# Patient Record
Sex: Female | Born: 1970 | Race: White | Hispanic: No | Marital: Married | Smoking: Former smoker
Health system: Southern US, Community
[De-identification: ages and names within clinical notes are randomized; demographics above are authoritative.]

## PROBLEM LIST (undated history)

## (undated) DIAGNOSIS — Z79899 Other long term (current) drug therapy: Secondary | ICD-10-CM

## (undated) DIAGNOSIS — E669 Obesity, unspecified: Secondary | ICD-10-CM

## (undated) DIAGNOSIS — A6009 Herpesviral infection of other urogenital tract: Secondary | ICD-10-CM

## (undated) DIAGNOSIS — E559 Vitamin D deficiency, unspecified: Secondary | ICD-10-CM

## (undated) DIAGNOSIS — K449 Diaphragmatic hernia without obstruction or gangrene: Secondary | ICD-10-CM

## (undated) DIAGNOSIS — F419 Anxiety disorder, unspecified: Secondary | ICD-10-CM

## (undated) DIAGNOSIS — E119 Type 2 diabetes mellitus without complications: Secondary | ICD-10-CM

## (undated) DIAGNOSIS — R1310 Dysphagia, unspecified: Secondary | ICD-10-CM

## (undated) DIAGNOSIS — K635 Polyp of colon: Secondary | ICD-10-CM

## (undated) DIAGNOSIS — E538 Deficiency of other specified B group vitamins: Secondary | ICD-10-CM

## (undated) DIAGNOSIS — F4310 Post-traumatic stress disorder, unspecified: Secondary | ICD-10-CM

## (undated) DIAGNOSIS — K625 Hemorrhage of anus and rectum: Secondary | ICD-10-CM

## (undated) DIAGNOSIS — K219 Gastro-esophageal reflux disease without esophagitis: Secondary | ICD-10-CM

## (undated) DIAGNOSIS — E785 Hyperlipidemia, unspecified: Secondary | ICD-10-CM

## (undated) DIAGNOSIS — Z8719 Personal history of other diseases of the digestive system: Secondary | ICD-10-CM

## (undated) DIAGNOSIS — F319 Bipolar disorder, unspecified: Secondary | ICD-10-CM

## (undated) DIAGNOSIS — K760 Fatty (change of) liver, not elsewhere classified: Secondary | ICD-10-CM

## (undated) HISTORY — PX: ABDOMINAL HYSTERECTOMY: SHX81

## (undated) HISTORY — PX: LIVER BIOPSY: SHX301

## (undated) HISTORY — PX: CHOLECYSTECTOMY: SHX55

## (undated) HISTORY — PX: COLONOSCOPY: SHX174

## (undated) HISTORY — PX: FLEXIBLE SIGMOIDOSCOPY: SHX1649

## (undated) HISTORY — PX: TONSILLECTOMY: SUR1361

---

## 1998-04-28 ENCOUNTER — Emergency Department (HOSPITAL_COMMUNITY): Admission: EM | Admit: 1998-04-28 | Discharge: 1998-04-28 | Payer: Self-pay | Admitting: Emergency Medicine

## 1998-04-29 ENCOUNTER — Ambulatory Visit (HOSPITAL_COMMUNITY): Admission: AD | Admit: 1998-04-29 | Discharge: 1998-04-29 | Payer: Self-pay | Admitting: Obstetrics and Gynecology

## 1998-04-29 ENCOUNTER — Other Ambulatory Visit: Admission: RE | Admit: 1998-04-29 | Discharge: 1998-04-29 | Payer: Self-pay | Admitting: Obstetrics and Gynecology

## 2005-04-28 ENCOUNTER — Emergency Department: Payer: Self-pay | Admitting: Unknown Physician Specialty

## 2005-04-28 ENCOUNTER — Other Ambulatory Visit: Payer: Self-pay

## 2005-11-22 ENCOUNTER — Encounter: Payer: Self-pay | Admitting: Emergency Medicine

## 2006-11-29 ENCOUNTER — Ambulatory Visit: Payer: Self-pay

## 2007-07-29 ENCOUNTER — Ambulatory Visit: Payer: Self-pay | Admitting: Unknown Physician Specialty

## 2007-08-31 ENCOUNTER — Emergency Department: Payer: Self-pay | Admitting: Emergency Medicine

## 2007-09-18 ENCOUNTER — Ambulatory Visit: Payer: Self-pay | Admitting: Emergency Medicine

## 2008-11-21 ENCOUNTER — Emergency Department: Payer: Self-pay | Admitting: Emergency Medicine

## 2008-11-27 ENCOUNTER — Ambulatory Visit: Payer: Self-pay | Admitting: Family Medicine

## 2011-01-12 ENCOUNTER — Ambulatory Visit: Payer: Self-pay

## 2011-01-19 ENCOUNTER — Ambulatory Visit: Payer: Self-pay

## 2011-05-17 ENCOUNTER — Ambulatory Visit: Payer: Self-pay | Admitting: Family Medicine

## 2011-05-24 ENCOUNTER — Ambulatory Visit: Payer: Self-pay | Admitting: Family Medicine

## 2012-08-02 ENCOUNTER — Ambulatory Visit: Payer: Self-pay | Admitting: Unknown Physician Specialty

## 2012-08-19 ENCOUNTER — Ambulatory Visit: Payer: Self-pay | Admitting: Unknown Physician Specialty

## 2012-08-20 LAB — PATHOLOGY REPORT

## 2014-08-25 ENCOUNTER — Ambulatory Visit: Payer: BLUE CROSS/BLUE SHIELD | Admitting: Podiatry

## 2015-05-26 ENCOUNTER — Other Ambulatory Visit: Payer: Self-pay | Admitting: Family Medicine

## 2015-05-26 DIAGNOSIS — Z1231 Encounter for screening mammogram for malignant neoplasm of breast: Secondary | ICD-10-CM

## 2015-06-08 ENCOUNTER — Ambulatory Visit
Admission: RE | Admit: 2015-06-08 | Discharge: 2015-06-08 | Disposition: A | Discharge status: Home / Self Care | Payer: BLUE CROSS/BLUE SHIELD | Source: Ambulatory Visit | Attending: Family Medicine | Admitting: Family Medicine

## 2015-06-08 DIAGNOSIS — Z1231 Encounter for screening mammogram for malignant neoplasm of breast: Secondary | ICD-10-CM | POA: Diagnosis not present

## 2016-06-09 ENCOUNTER — Ambulatory Visit
Admission: RE | Admit: 2016-06-09 | Discharge: 2016-06-09 | Disposition: A | Discharge status: Home / Self Care | Payer: BLUE CROSS/BLUE SHIELD | Source: Ambulatory Visit | Attending: Family Medicine | Admitting: Family Medicine

## 2016-06-09 ENCOUNTER — Other Ambulatory Visit: Payer: Self-pay | Admitting: Family Medicine

## 2016-06-09 DIAGNOSIS — Z1231 Encounter for screening mammogram for malignant neoplasm of breast: Secondary | ICD-10-CM | POA: Diagnosis not present

## 2016-06-09 DIAGNOSIS — R928 Other abnormal and inconclusive findings on diagnostic imaging of breast: Secondary | ICD-10-CM | POA: Diagnosis not present

## 2016-06-14 ENCOUNTER — Other Ambulatory Visit: Payer: Self-pay | Admitting: Family Medicine

## 2016-06-14 DIAGNOSIS — N6310 Unspecified lump in the right breast, unspecified quadrant: Secondary | ICD-10-CM

## 2016-06-29 ENCOUNTER — Ambulatory Visit: Payer: BLUE CROSS/BLUE SHIELD

## 2016-08-03 ENCOUNTER — Encounter: Payer: Self-pay | Admitting: *Deleted

## 2016-08-04 ENCOUNTER — Ambulatory Visit
Admission: RE | Admit: 2016-08-04 | Discharge: 2016-08-04 | Disposition: A | Discharge status: Home / Self Care | Payer: BLUE CROSS/BLUE SHIELD | Source: Ambulatory Visit | Attending: Unknown Physician Specialty | Admitting: Unknown Physician Specialty

## 2016-08-04 ENCOUNTER — Encounter
Admission: RE | Disposition: A | Discharge status: Home / Self Care | Payer: Self-pay | Source: Ambulatory Visit | Attending: Unknown Physician Specialty

## 2016-08-04 ENCOUNTER — Ambulatory Visit: Payer: BLUE CROSS/BLUE SHIELD | Admitting: Anesthesiology

## 2016-08-04 DIAGNOSIS — Z7984 Long term (current) use of oral hypoglycemic drugs: Secondary | ICD-10-CM | POA: Diagnosis not present

## 2016-08-04 DIAGNOSIS — A6000 Herpesviral infection of urogenital system, unspecified: Secondary | ICD-10-CM | POA: Diagnosis not present

## 2016-08-04 DIAGNOSIS — D123 Benign neoplasm of transverse colon: Secondary | ICD-10-CM | POA: Insufficient documentation

## 2016-08-04 DIAGNOSIS — F419 Anxiety disorder, unspecified: Secondary | ICD-10-CM | POA: Diagnosis not present

## 2016-08-04 DIAGNOSIS — Z87891 Personal history of nicotine dependence: Secondary | ICD-10-CM | POA: Diagnosis not present

## 2016-08-04 DIAGNOSIS — Z888 Allergy status to other drugs, medicaments and biological substances status: Secondary | ICD-10-CM | POA: Diagnosis not present

## 2016-08-04 DIAGNOSIS — E669 Obesity, unspecified: Secondary | ICD-10-CM | POA: Diagnosis not present

## 2016-08-04 DIAGNOSIS — E785 Hyperlipidemia, unspecified: Secondary | ICD-10-CM | POA: Insufficient documentation

## 2016-08-04 DIAGNOSIS — Z86010 Personal history of colonic polyps: Secondary | ICD-10-CM | POA: Diagnosis not present

## 2016-08-04 DIAGNOSIS — K648 Other hemorrhoids: Secondary | ICD-10-CM | POA: Insufficient documentation

## 2016-08-04 DIAGNOSIS — E119 Type 2 diabetes mellitus without complications: Secondary | ICD-10-CM | POA: Insufficient documentation

## 2016-08-04 DIAGNOSIS — K219 Gastro-esophageal reflux disease without esophagitis: Secondary | ICD-10-CM | POA: Diagnosis not present

## 2016-08-04 DIAGNOSIS — K449 Diaphragmatic hernia without obstruction or gangrene: Secondary | ICD-10-CM | POA: Diagnosis not present

## 2016-08-04 DIAGNOSIS — D122 Benign neoplasm of ascending colon: Secondary | ICD-10-CM | POA: Insufficient documentation

## 2016-08-04 DIAGNOSIS — F319 Bipolar disorder, unspecified: Secondary | ICD-10-CM | POA: Diagnosis not present

## 2016-08-04 DIAGNOSIS — Z90710 Acquired absence of both cervix and uterus: Secondary | ICD-10-CM | POA: Insufficient documentation

## 2016-08-04 DIAGNOSIS — Z880 Allergy status to penicillin: Secondary | ICD-10-CM | POA: Diagnosis not present

## 2016-08-04 DIAGNOSIS — K222 Esophageal obstruction: Secondary | ICD-10-CM | POA: Insufficient documentation

## 2016-08-04 DIAGNOSIS — Z6831 Body mass index (BMI) 31.0-31.9, adult: Secondary | ICD-10-CM | POA: Insufficient documentation

## 2016-08-04 DIAGNOSIS — K760 Fatty (change of) liver, not elsewhere classified: Secondary | ICD-10-CM | POA: Insufficient documentation

## 2016-08-04 DIAGNOSIS — Z1211 Encounter for screening for malignant neoplasm of colon: Secondary | ICD-10-CM | POA: Insufficient documentation

## 2016-08-04 HISTORY — DX: Fatty (change of) liver, not elsewhere classified: K76.0

## 2016-08-04 HISTORY — DX: Personal history of other diseases of the digestive system: Z87.19

## 2016-08-04 HISTORY — DX: Hyperlipidemia, unspecified: E78.5

## 2016-08-04 HISTORY — DX: Diaphragmatic hernia without obstruction or gangrene: K44.9

## 2016-08-04 HISTORY — DX: Herpesviral infection of other urogenital tract: A60.09

## 2016-08-04 HISTORY — DX: Dysphagia, unspecified: R13.10

## 2016-08-04 HISTORY — PX: COLONOSCOPY WITH PROPOFOL: SHX5780

## 2016-08-04 HISTORY — DX: Hemorrhage of anus and rectum: K62.5

## 2016-08-04 HISTORY — DX: Polyp of colon: K63.5

## 2016-08-04 HISTORY — DX: Bipolar disorder, unspecified: F31.9

## 2016-08-04 HISTORY — DX: Gastro-esophageal reflux disease without esophagitis: K21.9

## 2016-08-04 HISTORY — DX: Anxiety disorder, unspecified: F41.9

## 2016-08-04 HISTORY — DX: Type 2 diabetes mellitus without complications: E11.9

## 2016-08-04 HISTORY — PX: ESOPHAGOGASTRODUODENOSCOPY (EGD) WITH PROPOFOL: SHX5813

## 2016-08-04 HISTORY — DX: Obesity, unspecified: E66.9

## 2016-08-04 LAB — GLUCOSE, CAPILLARY: Glucose-Capillary: 96 mg/dL (ref 65–99)

## 2016-08-04 SURGERY — COLONOSCOPY WITH PROPOFOL
Anesthesia: General

## 2016-08-04 MED ORDER — PROPOFOL 500 MG/50ML IV EMUL
INTRAVENOUS | Status: AC
  Filled 2016-08-04: qty 50

## 2016-08-04 MED ORDER — FENTANYL CITRATE (PF) 100 MCG/2ML IJ SOLN
INTRAMUSCULAR | Status: AC
  Filled 2016-08-04: qty 2

## 2016-08-04 MED ORDER — LIDOCAINE HCL (PF) 2 % IJ SOLN
INTRAMUSCULAR | Status: DC | PRN
Start: 2016-08-04 — End: 2016-08-04
  Administered 2016-08-04: 50 mg

## 2016-08-04 MED ORDER — FENTANYL CITRATE (PF) 100 MCG/2ML IJ SOLN
INTRAMUSCULAR | Status: DC | PRN
  Administered 2016-08-04 (×2): 50 ug via INTRAVENOUS

## 2016-08-04 MED ORDER — SODIUM CHLORIDE 0.9 % IV SOLN
INTRAVENOUS | Status: DC
  Administered 2016-08-04: 09:00:00 via INTRAVENOUS

## 2016-08-04 MED ORDER — PHENYLEPHRINE HCL 10 MG/ML IJ SOLN
INTRAMUSCULAR | Status: DC | PRN
  Administered 2016-08-04 (×3): 80 ug via INTRAVENOUS

## 2016-08-04 MED ORDER — PROPOFOL 10 MG/ML IV BOLUS
INTRAVENOUS | Status: DC | PRN
  Administered 2016-08-04: 50 mg via INTRAVENOUS

## 2016-08-04 MED ORDER — MIDAZOLAM HCL 5 MG/5ML IJ SOLN
INTRAMUSCULAR | Status: DC | PRN
  Administered 2016-08-04: 2 mg via INTRAVENOUS
  Administered 2016-08-04 (×2): 1 mg via INTRAVENOUS

## 2016-08-04 MED ORDER — MIDAZOLAM HCL 2 MG/2ML IJ SOLN
INTRAMUSCULAR | Status: AC
Start: 2016-08-04 — End: 2016-08-04
  Filled 2016-08-04: qty 2

## 2016-08-04 MED ORDER — MIDAZOLAM HCL 2 MG/2ML IJ SOLN
INTRAMUSCULAR | Status: AC
  Filled 2016-08-04: qty 2

## 2016-08-04 MED ORDER — SODIUM CHLORIDE 0.9 % IV SOLN: INTRAVENOUS | Status: DC

## 2016-08-04 MED ORDER — PROPOFOL 10 MG/ML IV BOLUS
INTRAVENOUS | Status: AC
  Filled 2016-08-04: qty 20

## 2016-08-04 MED ORDER — PROPOFOL 500 MG/50ML IV EMUL
INTRAVENOUS | Status: DC | PRN
  Administered 2016-08-04: 100 ug/kg/min via INTRAVENOUS

## 2016-08-04 NOTE — Transfer of Care (Signed)
Immediate Anesthesia Transfer of Care Note  Patient: Alison Mcclain  Procedure(s) Performed: Procedure(s): COLONOSCOPY WITH PROPOFOL (N/A) ESOPHAGOGASTRODUODENOSCOPY (EGD) WITH PROPOFOL (N/A)  Patient Location: PACU  Anesthesia Type:General  Level of Consciousness: sedated  Airway & Oxygen Therapy: Patient Spontanous Breathing and Patient connected to nasal cannula oxygen  Post-op Assessment: Report given to RN and Post -op Vital signs reviewed and stable  Post vital signs: Reviewed and stable  Last Vitals:  Vitals:   08/04/16 0830  BP: 100/74  Pulse: 77  Resp: 18  Temp: 36.5 C    Last Pain:  Vitals:   08/04/16 0830  TempSrc: Tympanic         Complications: No apparent anesthesia complications

## 2016-08-04 NOTE — Op Note (Signed)
[facility] Patient Name: Alison Mcclain Procedure Date: 08/04/2016 9:43 AM MRN: [id number] Account #: [id number] Date of Birth: 02-Jan-1971 Admit Type: Outpatient Age: 46 Room: [facility] ENDO ROOM 3 Gender: Female Note Status: Finalized Procedure:            Colonoscopy Indications:          High risk colon cancer surveillance: Personal history                        of colonic polyps Providers:            Manya Silvas, MD Referring MD:         Rubbie Battiest. Iona Beard MD, MD (Referring MD) Medicines:            Propofol per Anesthesia Complications:        No immediate complications. Procedure:            Pre-Anesthesia Assessment:                       - After reviewing the risks and benefits, the patient                        was deemed in satisfactory condition to undergo the                        procedure.                       After obtaining informed consent, the colonoscope was                        passed under direct vision. Throughout the procedure,                        the patient's blood pressure, pulse, and oxygen                        saturations were monitored continuously. The                        Colonoscope was introduced through the anus and                        advanced to the the cecum, identified by appendiceal                        orifice and ileocecal valve. The colonoscopy was                        somewhat difficult due to restricted mobility of the                        colon and a tortuous colon. Successful completion of                        the procedure was aided by applying abdominal pressure.                        The patient tolerated the procedure well. The quality  of the bowel preparation was adequate to identify                        polyps 6 mm and larger in size. Findings:      A small polyp was found in the proximal ascending colon. The polyp was       sessile. The polyp was  removed with a cold snare. Resection and       retrieval were complete.      A small polyp was found in the hepatic flexure. The polyp was sessile.       The polyp was removed with a hot snare. Resection and retrieval were       complete.      Internal hemorrhoids were found during endoscopy. The hemorrhoids were       small.      The exam was otherwise without abnormality. Impression:           - One small polyp in the proximal ascending colon,                        removed with a cold snare. Resected and retrieved.                       - One small polyp at the hepatic flexure, removed with                        a hot snare. Resected and retrieved.                       - Internal hemorrhoids.                       - The examination was otherwise normal. Recommendation:       - Await pathology results. Manya Silvas, MD 08/04/2016 10:41:20 AM This report has been signed electronically. Number of Addenda: 0 Note Initiated On: 08/04/2016 9:43 AM Scope Withdrawal Time: 0 hours 16 minutes 10 seconds  Total Procedure Duration: 0 hours 32 minutes 30 seconds       [facility]

## 2016-08-04 NOTE — Anesthesia Postprocedure Evaluation (Signed)
Anesthesia Post Note  Patient: Alison Mcclain  Procedure(s) Performed: Procedure(s) (LRB): COLONOSCOPY WITH PROPOFOL (N/A) ESOPHAGOGASTRODUODENOSCOPY (EGD) WITH PROPOFOL (N/A)  Patient location during evaluation: Endoscopy Anesthesia Type: General Level of consciousness: awake and alert and oriented Pain management: pain level controlled Vital Signs Assessment: post-procedure vital signs reviewed and stable Respiratory status: spontaneous breathing, nonlabored ventilation and respiratory function stable Cardiovascular status: blood pressure returned to baseline and stable Postop Assessment: no signs of nausea or vomiting Anesthetic complications: no     Last Vitals:  Vitals:   08/04/16 1042 08/04/16 1052  BP: (!) 85/59 90/61  Pulse: 74 71  Resp: (!) 22 12  Temp: 36.7 C     Last Pain:  Vitals:   08/04/16 1042  TempSrc: Tympanic                 Karli Wickizer

## 2016-08-04 NOTE — Op Note (Signed)
[facility]enter[facility] Patient Name: Alison Mcclain Procedure Date: 08/04/2016 9:44 AM MRN: [id number] Account #: [id number] Date of Birth: Apr 02, 1971 Admit Type: Outpatient Age: 46 Room: [facility] ENDO ROOM 3 Gender: Female Note Status: Finalized Procedure:            Upper GI endoscopy Indications:          Dysphagia Providers:            Manya Silvas, MD Referring MD:         Rubbie Battiest. Iona Beard MD, MD (Referring MD) Medicines:            Propofol per Anesthesia Complications:        No immediate complications. Procedure:            Pre-Anesthesia Assessment:                       - After reviewing the risks and benefits, the patient                        was deemed in satisfactory condition to undergo the                        procedure.                       - After reviewing the risks and benefits, the patient                        was deemed in satisfactory condition to undergo the                        procedure.                       After obtaining informed consent, the endoscope was                        passed under direct vision. Throughout the procedure,                        the patient's blood pressure, pulse, and oxygen                        saturations were monitored continuously. The Endoscope                        was introduced through the mouth, and advanced to the                        second part of duodenum. The upper GI endoscopy was                        accomplished without difficulty. The patient tolerated                        the procedure well. Findings:      A mild Schatzki ring (acquired) was found at the gastroesophageal       junction. A guidewire was placed and the scope was withdrawn. Dilation       was performed with a Savary dilator with mild resistance at 16 mm and 17  mm.      A small hiatal hernia was present.      The examined duodenum was normal. Impression:           - Mild Schatzki ring. Dilated.                      - Small hiatal hernia.                       - Normal examined duodenum.                       - No specimens collected. Recommendation:       - soft food for 3 days, eat slowly, chew well, take                        small bites Procedure Code(s):    --- Professional ---                       [phone], Esophagogastroduodenoscopy, flexible, transoral;                        with insertion of guide wire followed by passage of                        dilator(s) through esophagus over guide wire Diagnosis Code(s):    --- Professional ---                       K22.2, Esophageal obstruction                       K44.9, Diaphragmatic hernia without obstruction or                        gangrene                       R13.10, Dysphagia, unspecified CPT copyright 2016 American Medical Association. All rights reserved. The codes documented in this report are preliminary and upon coder review may  be revised to meet current compliance requirements. Manya Silvas, MD 08/04/2016 10:01:27 AM This report has been signed electronically. Number of Addenda: 0 Note Initiated On: 08/04/2016 9:44 AM      [facility]

## 2016-08-04 NOTE — Anesthesia Preprocedure Evaluation (Signed)
Anesthesia Evaluation  Patient identified by MRN, date of birth, ID band Patient awake    Reviewed: Allergy & Precautions, NPO status , Patient's Chart, lab work & pertinent test results  History of Anesthesia Complications Negative for: history of anesthetic complications  Airway Mallampati: II  TM Distance: >3 FB Neck ROM: Full    Dental no notable dental hx.    Pulmonary neg sleep apnea, neg COPD, former smoker,    breath sounds clear to auscultation- rhonchi (-) wheezing      Cardiovascular Exercise Tolerance: Good (-) hypertension(-) CAD and (-) Past MI  Rhythm:Regular Rate:Normal - Systolic murmurs and - Diastolic murmurs    Neuro/Psych Anxiety Bipolar Disorder negative neurological ROS     GI/Hepatic Neg liver ROS, hiatal hernia, GERD  ,  Endo/Other  diabetes, Type 2, Oral Hypoglycemic Agents  Renal/GU negative Renal ROS     Musculoskeletal negative musculoskeletal ROS (+)   Abdominal (+) + obese,   Peds  Hematology negative hematology ROS (+)   Anesthesia Other Findings Past Medical History: No date: Anxiety No date: Bipolar 1 disorder (HCC) No date: Colon polyp No date: Diabetes mellitus without complication (HCC) No date: Dysphagia No date: Fatty liver No date: GERD (gastroesophageal reflux disease) No date: Herpes genitalis in women No date: Hiatal hernia No date: History of hiatal hernia No date: Hyperlipidemia No date: Obesity No date: Rectal bleeding   Reproductive/Obstetrics                             Anesthesia Physical Anesthesia Plan  ASA: III  Anesthesia Plan: General   Post-op Pain Management:    Induction: Intravenous  Airway Management Planned: Natural Airway  Additional Equipment:   Intra-op Plan:   Post-operative Plan:   Informed Consent: I have reviewed the patients History and Physical, chart, labs and discussed the procedure including  the risks, benefits and alternatives for the proposed anesthesia with the patient or authorized representative who has indicated his/her understanding and acceptance.   Dental advisory given  Plan Discussed with: CRNA and Anesthesiologist  Anesthesia Plan Comments:         Anesthesia Quick Evaluation

## 2016-08-04 NOTE — H&P (Signed)
Primary Care Physician:  Sharyne Peach, MD Primary Gastroenterologist:  Dr. Vira Agar  Pre-Procedure History & Physical: HPI:  Alison Mcclain is a 46 y.o. female is here for an endoscopy and colonoscopy.  She has dysphagia, GERD, rectal bleeding, hx of colon polyps.   Past Medical History:  Diagnosis Date  . Anxiety   . Bipolar 1 disorder ([place])   . Colon polyp   . Diabetes mellitus without complication ([place])   . Dysphagia   . Fatty liver   . GERD (gastroesophageal reflux disease)   . Herpes genitalis in women   . Hiatal hernia   . History of hiatal hernia   . Hyperlipidemia   . Obesity   . Rectal bleeding     Past Surgical History:  Procedure Laterality Date  . ABDOMINAL HYSTERECTOMY    . CHOLECYSTECTOMY    . COLONOSCOPY    . LIVER BIOPSY    . TONSILLECTOMY      Prior to Admission medications   Medication Sig Start Date End Date Taking? Authorizing Provider  buPROPion (WELLBUTRIN XL) 150 MG 24 hr tablet Take 150 mg by mouth daily.   Yes Historical Provider, MD  clonazePAM (KLONOPIN) 0.5 MG tablet Take 0.5 mg by mouth at bedtime. May take 1 tab daily prn   Yes Historical Provider, MD  glipiZIDE (GLUCOTROL XL) 10 MG 24 hr tablet Take 10 mg by mouth daily. Take 1 tablet by mouth once a day   Yes Historical Provider, MD  lithium 300 MG tablet Take 900 mg by mouth at bedtime.   Yes Historical Provider, MD  simvastatin (ZOCOR) 10 MG tablet Take 10 mg by mouth daily.   Yes Historical Provider, MD  sitaGLIPtin (JANUVIA) 100 MG tablet Take 100 mg by mouth daily.   Yes Historical Provider, MD  SUMAtriptan (IMITREX) 25 MG tablet Take by mouth.   Yes Historical Provider, MD  traZODone (DESYREL) 100 MG tablet Take 200 mg by mouth at bedtime.   Yes Historical Provider, MD  hydrocortisone (ANUSOL-HC) 25 MG suppository Place 25 mg rectally 2 (two) times daily.    Historical Provider, MD    Allergies as of 08/01/2016  . (Not on File)    Family History  Problem Relation Age of  Onset  . Breast cancer Mother 49    Social History   Social History  . Marital status: Married    Spouse name: N/A  . Number of children: N/A  . Years of education: N/A   Occupational History  . Not on file.   Social History Main Topics  . Smoking status: Former Smoker    Quit date: 07/24/2010  . Smokeless tobacco: Never Used  . Alcohol use Yes     Comment: 1.0-2 ounce a week last dose 1 beer last w/e  . Drug use: No  . Sexual activity: Not on file   Other Topics Concern  . Not on file   Social History Narrative  . No narrative on file    Review of Systems: See HPI, otherwise negative ROS  Physical Exam: BP 100/74   Pulse 77   Temp 97.7 F (36.5 C) (Tympanic)   Resp 18   Ht 5\' 6"  (1.676 m)   Wt 88.5 kg (195 lb)   SpO2 97%   BMI 31.47 kg/m  General:   Alert,  pleasant and cooperative in NAD Head:  Normocephalic and atraumatic. Neck:  Supple; no masses or thyromegaly. Lungs:  Clear throughout to auscultation.  Heart:  Regular rate and rhythm. Abdomen:  Soft, nontender and nondistended. Normal bowel sounds, without guarding, and without rebound.   Neurologic:  Alert and  oriented x4;  grossly normal neurologically.  Impression/Plan: Alison Mcclain is here for an endoscopy and colonoscopy to be performed for rectal bleeding, PH colon polyps, abdominal pain. Dysphagia.  Risks, benefits, limitations, and alternatives regarding  endoscopy and colonoscopy have been reviewed with the patient.  Questions have been answered.  All parties agreeable.   Gaylyn Cheers, MD  08/04/2016, 9:42 AM

## 2016-08-06 ENCOUNTER — Encounter: Payer: Self-pay | Admitting: Unknown Physician Specialty

## 2016-08-07 LAB — SURGICAL PATHOLOGY

## 2019-11-05 ENCOUNTER — Other Ambulatory Visit: Payer: Self-pay | Admitting: Otolaryngology

## 2019-11-05 DIAGNOSIS — R1310 Dysphagia, unspecified: Secondary | ICD-10-CM

## 2019-11-10 ENCOUNTER — Ambulatory Visit: Payer: Self-pay | Attending: Otolaryngology

## 2019-11-28 ENCOUNTER — Other Ambulatory Visit: Payer: Self-pay | Admitting: Otolaryngology

## 2019-11-28 ENCOUNTER — Ambulatory Visit
Admission: RE | Admit: 2019-11-28 | Discharge: 2019-11-28 | Disposition: A | Discharge status: Home / Self Care | Payer: BC Managed Care – PPO | Source: Ambulatory Visit | Attending: Otolaryngology | Admitting: Otolaryngology

## 2019-11-28 ENCOUNTER — Other Ambulatory Visit: Payer: Self-pay

## 2019-11-28 DIAGNOSIS — R1310 Dysphagia, unspecified: Secondary | ICD-10-CM | POA: Diagnosis not present

## 2019-12-01 ENCOUNTER — Ambulatory Visit: Payer: Self-pay

## 2022-01-15 IMAGING — RF DG ESOPHAGUS
7 series · 14 of 24 positions shown · non-contrast
Comparison: None.

CLINICAL DATA: Dysphagia

EXAM:
ESOPHOGRAM / BARIUM SWALLOW / BARIUM TABLET STUDY
TECHNIQUE: Combined double contrast and single contrast examination performed
using effervescent crystals, thick barium liquid, and thin barium
liquid. The patient was observed with fluoroscopy swallowing a 13 mm
barium sulphate tablet.
FLUOROSCOPY TIME:  Fluoroscopy Time:  1 minutes and 30 seconds.
Radiation Exposure Index (if provided by the fluoroscopic device):
73 mGy
Number of Acquired Spot Images:

[Series 1: fluoro_barium bariatric 2fps_bw · 0.17mm/px · 2 of 14 frames shown (1 of 5)]
[frame 3/14]
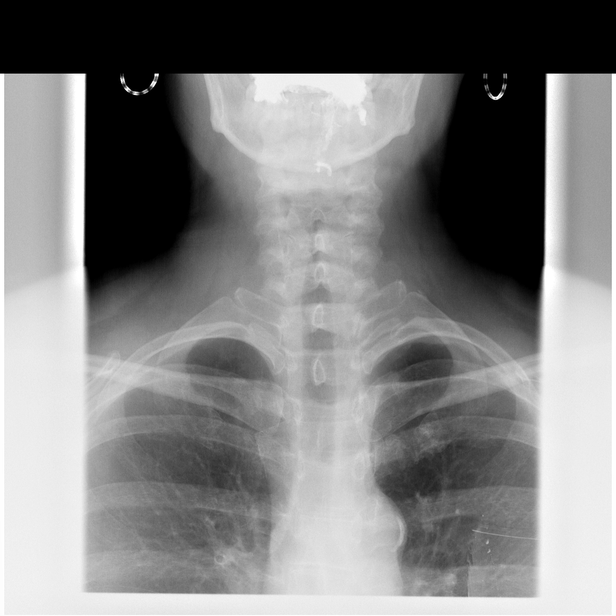
[frame 12/14]
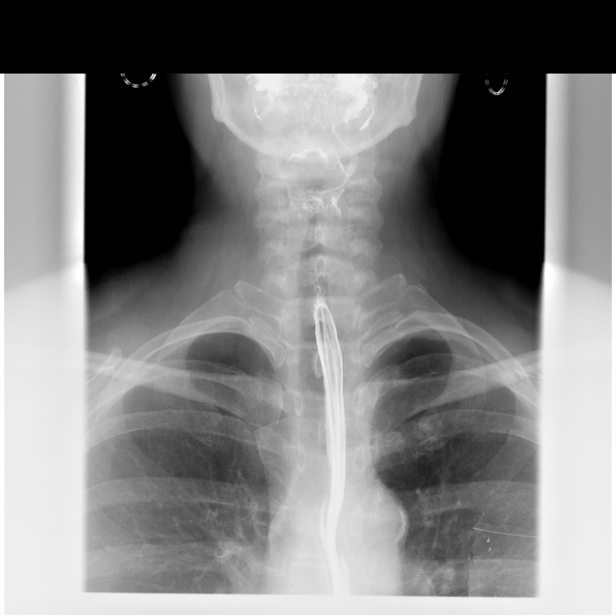

[Series 2: fluoro_barium bariatric 2fps_bw · 0.17mm/px · 1 of 18 frames shown (2 of 5)]
[frame 14/18]
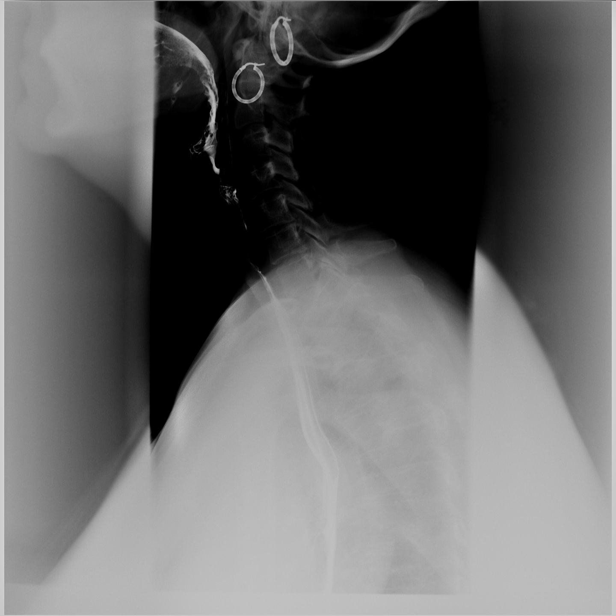

[Series 3: fluoro_barium bariatric 2fps_bw · 0.18mm/px · 3 of 40 frames shown (3 of 5)]
[frame 7/40]
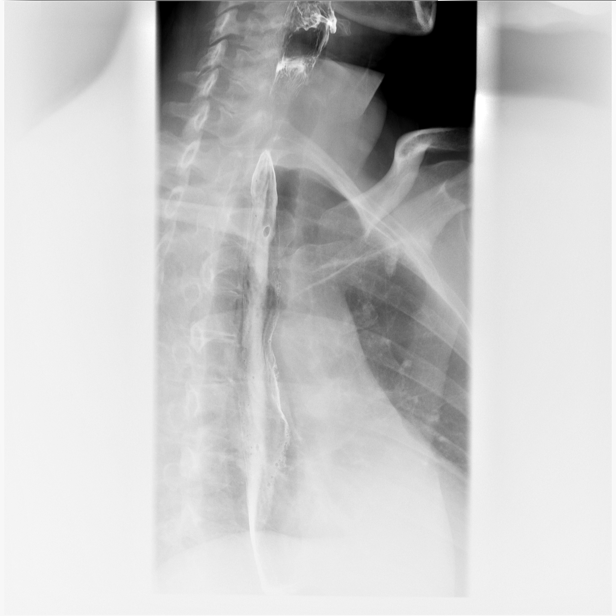
[frame 21/40]
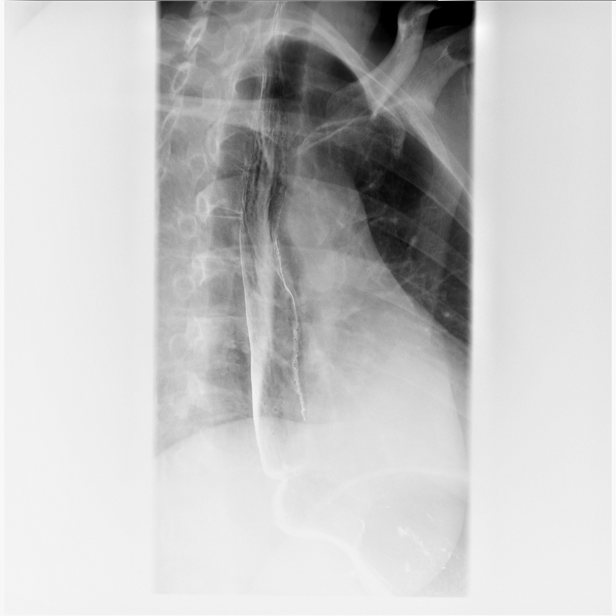
[frame 35/40]
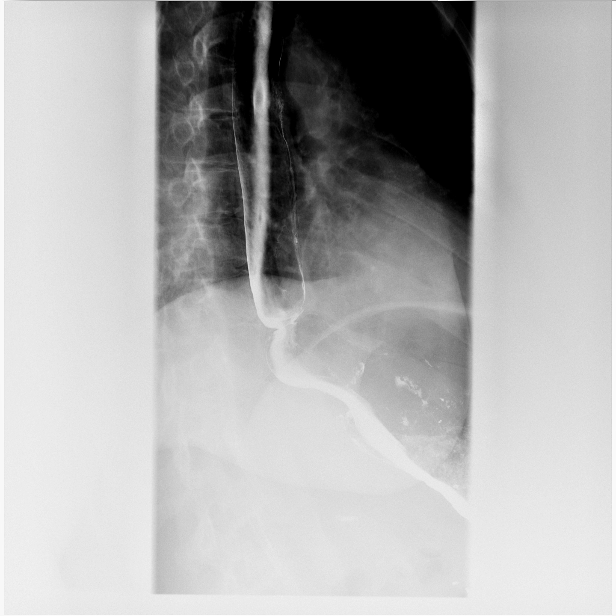

[Series 4: cp_bariatric · 0.28mm/px · 2 of 23 frames shown (1 of 2)]
[frame 12/23]
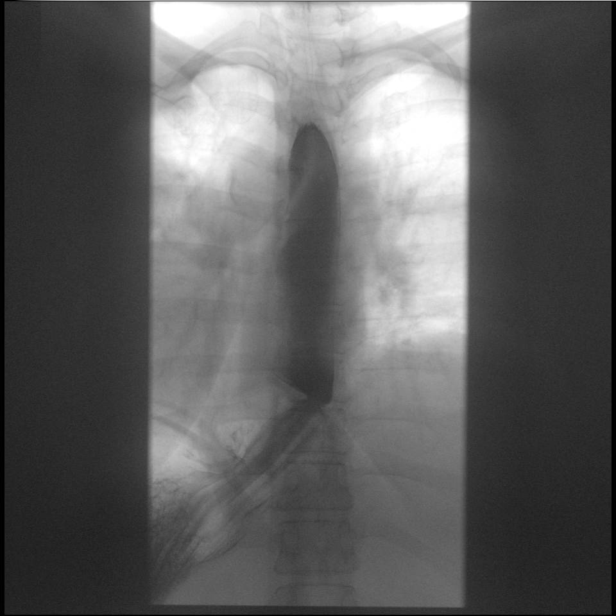
[frame 23/23]
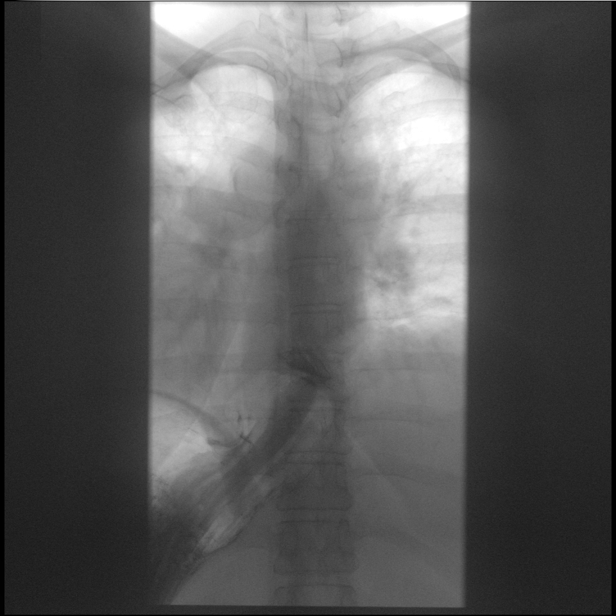

[Series 5: cp_bariatric · 0.28mm/px · 2 of 34 frames shown (2 of 2)]
[frame 6/34]
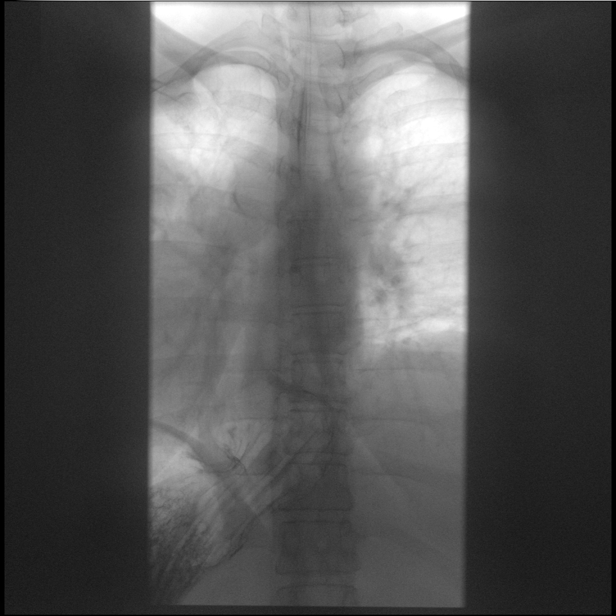
[frame 29/34]
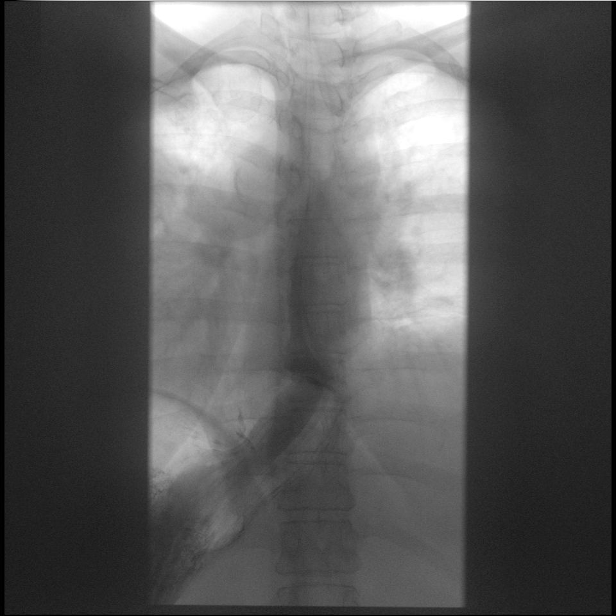

[Series 6: fluoro_barium bariatric 2fps_bw · 0.17mm/px · 2 of 20 frames shown (4 of 5)]
[frame 10/20]
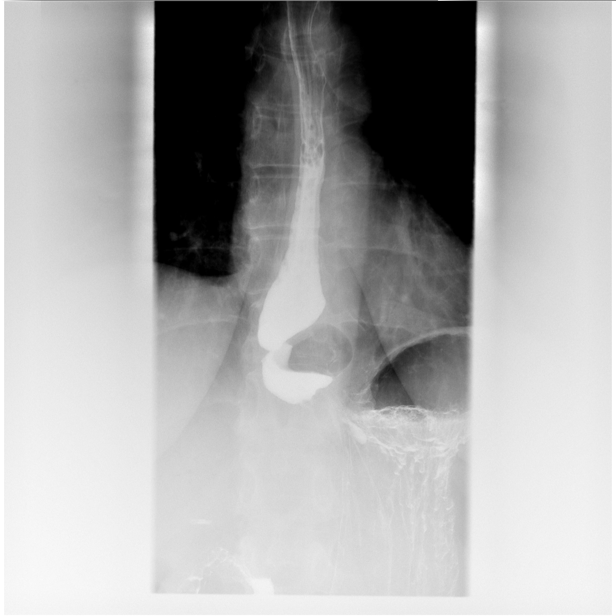
[frame 11/20]
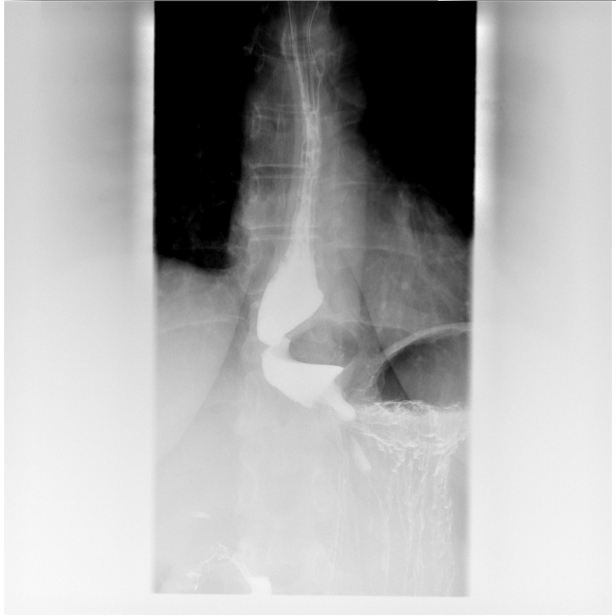

[Series 7: fluoro_barium bariatric 2fps_bw · 0.17mm/px · 2 of 8 frames shown (5 of 5)]
[frame 2/8]
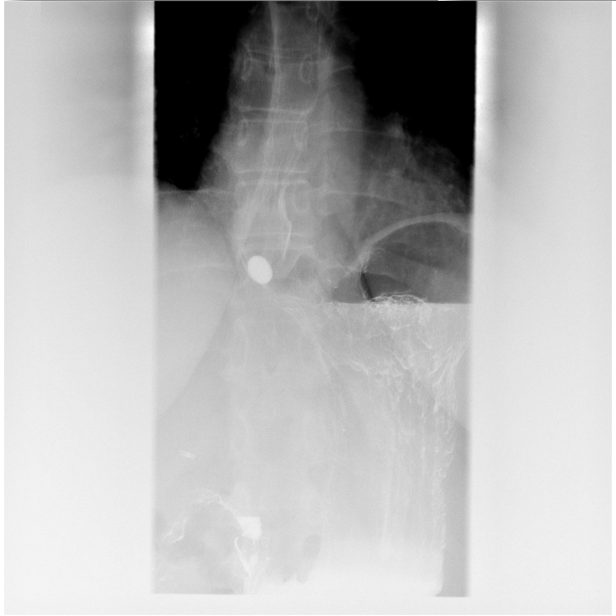
[frame 7/8]
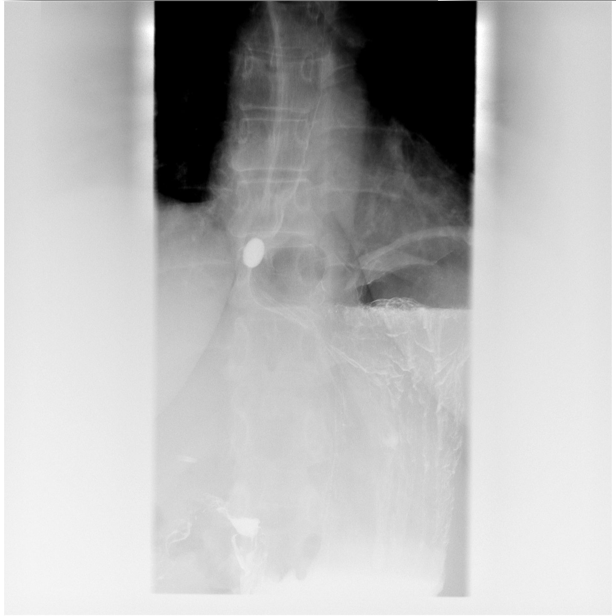

[14 of 24 positions shown; findings below may reference images not displayed]

FINDINGS: Frontal and lateral views of the hypopharynx while swallowing thick
barium are normal.

Double contrast imaging of the esophagus shows no evidence for
diverticulum, gross mucosal ulceration, or mass lesion. There is a
tight stricture at the esophagogastric junction. Small to moderate
sliding type hiatal hernia evident.

Assessment of esophageal motility reveals good preservation of
primary peristalsis on all swallows. No evidence for tertiary
contractions or presbyesophagus.

13 mm barium tablet becomes lodged at the distal esophageal
stricture despite repeated swallows of thin barium and water.
IMPRESSION: Small to moderate sliding type hiatal hernia with tight, smooth,
short segment stricture at the esophagogastric junction. This
stricture obstructs passage of a 13 mm barium tablet.

## 2022-10-31 ENCOUNTER — Other Ambulatory Visit: Payer: Self-pay | Admitting: Family Medicine

## 2022-10-31 DIAGNOSIS — Z1231 Encounter for screening mammogram for malignant neoplasm of breast: Secondary | ICD-10-CM

## 2022-11-28 ENCOUNTER — Ambulatory Visit
Admission: RE | Admit: 2022-11-28 | Discharge: 2022-11-28 | Disposition: A | Discharge status: Home / Self Care | Payer: BC Managed Care – PPO | Source: Ambulatory Visit | Attending: Family Medicine | Admitting: Family Medicine

## 2022-11-28 DIAGNOSIS — Z1231 Encounter for screening mammogram for malignant neoplasm of breast: Secondary | ICD-10-CM | POA: Diagnosis not present

## 2023-09-19 ENCOUNTER — Other Ambulatory Visit: Payer: Self-pay | Admitting: Family Medicine

## 2023-09-19 DIAGNOSIS — Z1231 Encounter for screening mammogram for malignant neoplasm of breast: Secondary | ICD-10-CM

## 2023-11-29 ENCOUNTER — Ambulatory Visit
Admission: RE | Admit: 2023-11-29 | Discharge: 2023-11-29 | Disposition: A | Discharge status: Home / Self Care | Payer: BC Managed Care – PPO | Source: Ambulatory Visit | Attending: Family Medicine | Admitting: Family Medicine

## 2023-11-29 DIAGNOSIS — Z1231 Encounter for screening mammogram for malignant neoplasm of breast: Secondary | ICD-10-CM | POA: Insufficient documentation

## 2024-01-15 ENCOUNTER — Encounter: Payer: Self-pay | Admitting: *Deleted

## 2024-02-04 ENCOUNTER — Other Ambulatory Visit: Payer: Self-pay

## 2024-02-04 ENCOUNTER — Ambulatory Visit: Admitting: Certified Registered"

## 2024-02-04 ENCOUNTER — Ambulatory Visit
Admission: RE | Admit: 2024-02-04 | Discharge: 2024-02-04 | Disposition: A | Discharge status: Home / Self Care | Payer: BC Managed Care – PPO | Attending: Gastroenterology | Admitting: Gastroenterology

## 2024-02-04 ENCOUNTER — Encounter
Admission: RE | Disposition: A | Discharge status: Home / Self Care | Payer: Self-pay | Source: Home / Self Care | Attending: Gastroenterology

## 2024-02-04 DIAGNOSIS — Z860101 Personal history of adenomatous and serrated colon polyps: Secondary | ICD-10-CM | POA: Diagnosis not present

## 2024-02-04 DIAGNOSIS — E119 Type 2 diabetes mellitus without complications: Secondary | ICD-10-CM | POA: Diagnosis not present

## 2024-02-04 DIAGNOSIS — Z7984 Long term (current) use of oral hypoglycemic drugs: Secondary | ICD-10-CM | POA: Insufficient documentation

## 2024-02-04 DIAGNOSIS — K222 Esophageal obstruction: Secondary | ICD-10-CM | POA: Insufficient documentation

## 2024-02-04 DIAGNOSIS — R1313 Dysphagia, pharyngeal phase: Secondary | ICD-10-CM | POA: Insufficient documentation

## 2024-02-04 DIAGNOSIS — Z6825 Body mass index (BMI) 25.0-25.9, adult: Secondary | ICD-10-CM | POA: Insufficient documentation

## 2024-02-04 DIAGNOSIS — Z1211 Encounter for screening for malignant neoplasm of colon: Secondary | ICD-10-CM | POA: Diagnosis present

## 2024-02-04 DIAGNOSIS — F319 Bipolar disorder, unspecified: Secondary | ICD-10-CM | POA: Diagnosis not present

## 2024-02-04 DIAGNOSIS — Z87891 Personal history of nicotine dependence: Secondary | ICD-10-CM | POA: Diagnosis not present

## 2024-02-04 DIAGNOSIS — E669 Obesity, unspecified: Secondary | ICD-10-CM | POA: Diagnosis not present

## 2024-02-04 DIAGNOSIS — K449 Diaphragmatic hernia without obstruction or gangrene: Secondary | ICD-10-CM | POA: Diagnosis not present

## 2024-02-04 DIAGNOSIS — K640 First degree hemorrhoids: Secondary | ICD-10-CM | POA: Insufficient documentation

## 2024-02-04 DIAGNOSIS — K219 Gastro-esophageal reflux disease without esophagitis: Secondary | ICD-10-CM | POA: Insufficient documentation

## 2024-02-04 DIAGNOSIS — Z79899 Other long term (current) drug therapy: Secondary | ICD-10-CM | POA: Diagnosis not present

## 2024-02-04 DIAGNOSIS — Z794 Long term (current) use of insulin: Secondary | ICD-10-CM | POA: Diagnosis not present

## 2024-02-04 DIAGNOSIS — F419 Anxiety disorder, unspecified: Secondary | ICD-10-CM | POA: Diagnosis not present

## 2024-02-04 DIAGNOSIS — E785 Hyperlipidemia, unspecified: Secondary | ICD-10-CM | POA: Insufficient documentation

## 2024-02-04 HISTORY — PX: ESOPHAGOGASTRODUODENOSCOPY (EGD) WITH PROPOFOL: SHX5813

## 2024-02-04 HISTORY — DX: Vitamin D deficiency, unspecified: E55.9

## 2024-02-04 HISTORY — PX: BALLOON ENTEROSCOPY: SHX6863

## 2024-02-04 HISTORY — DX: Post-traumatic stress disorder, unspecified: F43.10

## 2024-02-04 HISTORY — DX: Deficiency of other specified B group vitamins: E53.8

## 2024-02-04 HISTORY — PX: COLONOSCOPY WITH PROPOFOL: SHX5780

## 2024-02-04 HISTORY — DX: Other long term (current) drug therapy: Z79.899

## 2024-02-04 LAB — GLUCOSE, CAPILLARY: Glucose-Capillary: 132 mg/dL — ABNORMAL HIGH (ref 70–99)

## 2024-02-04 SURGERY — COLONOSCOPY WITH PROPOFOL
Anesthesia: General

## 2024-02-04 MED ORDER — LIDOCAINE HCL (CARDIAC) PF 100 MG/5ML IV SOSY
PREFILLED_SYRINGE | INTRAVENOUS | Status: DC | PRN
  Administered 2024-02-04: 80 mg via INTRAVENOUS

## 2024-02-04 MED ORDER — SODIUM CHLORIDE 0.9 % IV SOLN: INTRAVENOUS | Status: DC

## 2024-02-04 MED ORDER — PROPOFOL 500 MG/50ML IV EMUL
INTRAVENOUS | Status: DC | PRN
  Administered 2024-02-04: 130 ug/kg/min via INTRAVENOUS

## 2024-02-04 MED ORDER — PROPOFOL 10 MG/ML IV BOLUS
INTRAVENOUS | Status: DC | PRN
  Administered 2024-02-04: 80 mg via INTRAVENOUS

## 2024-02-04 MED ORDER — SODIUM CHLORIDE 0.9 % IV SOLN: INTRAVENOUS | Status: DC | PRN

## 2024-02-04 MED ORDER — DEXMEDETOMIDINE HCL IN NACL 80 MCG/20ML IV SOLN
INTRAVENOUS | Status: DC | PRN
Start: 2024-02-04 — End: 2024-02-04
  Administered 2024-02-04: 12 ug via INTRAVENOUS
  Administered 2024-02-04: 8 ug via INTRAVENOUS

## 2024-02-04 MED ORDER — PROPOFOL 1000 MG/100ML IV EMUL
INTRAVENOUS | Status: AC
  Filled 2024-02-04: qty 100

## 2024-02-04 NOTE — Op Note (Signed)
 [facility] Patient Name: Alison Mcclain Procedure Date: 02/04/2024 7:59 AM MRN: [id number] Account #: [id number] Date of Birth: 1971-04-30 Admit Type: Outpatient Age: 53 Room: [facility] ENDO ROOM 3 Gender: Female Note Status: Finalized Instrument Name: Barnie Endoscope [id number] Procedure:             Upper GI endoscopy Indications:           Dysphagia Providers:             Ole Schick MD, MD Referring MD:          Sionne A. Zachary MD, MD (Referring MD) Medicines:             Monitored Anesthesia Care Complications:         No immediate complications. Estimated blood loss:                         Minimal. Procedure:             Pre-Anesthesia Assessment:                        - Prior to the procedure, a History and Physical was                         performed, and patient medications and allergies were                         reviewed. The patient is competent. The risks and                         benefits of the procedure and the sedation options and                         risks were discussed with the patient. All questions                         were answered and informed consent was obtained.                         Patient identification and proposed procedure were                         verified by the physician, the nurse, the                         anesthesiologist, the anesthetist and the technician                         in the endoscopy suite. Mental Status Examination:                         alert and oriented. Airway Examination: normal                         oropharyngeal airway and neck mobility. Respiratory                         Examination: clear to auscultation. CV Examination:  normal. Prophylactic Antibiotics: The patient does not                         require prophylactic antibiotics. Prior                         Anticoagulants: The patient has taken no anticoagulant                          or antiplatelet agents. ASA Grade Assessment: II - A                         patient with mild systemic disease. After reviewing                         the risks and benefits, the patient was deemed in                         satisfactory condition to undergo the procedure. The                         anesthesia plan was to use monitored anesthesia care                         (MAC). Immediately prior to administration of                         medications, the patient was re-assessed for adequacy                         to receive sedatives. The heart rate, respiratory                         rate, oxygen saturations, blood pressure, adequacy of                         pulmonary ventilation, and response to care were                         monitored throughout the procedure. The physical                         status of the patient was re-assessed after the                         procedure.                        After obtaining informed consent, the endoscope was                         passed under direct vision. Throughout the procedure,                         the patient's blood pressure, pulse, and oxygen                         saturations were monitored continuously. The Endoscope  was introduced through the mouth, and advanced to the                         second part of duodenum. The upper GI endoscopy was                         accomplished without difficulty. The patient tolerated                         the procedure well. Findings:      A non-obstructing Schatzki ring was found in the lower third of the       esophagus. A TTS dilator was passed through the scope. Dilation with a       15-16.5-18 mm balloon dilator was performed to 18 mm. The dilation site       was examined and showed moderate mucosal disruption. Estimated blood       loss was minimal.      Normal mucosa was found in the entire esophagus. Biopsies were obtained       from the  proximal and distal esophagus with cold forceps for histology       of suspected eosinophilic esophagitis. Estimated blood loss was minimal.      A 5 cm hiatal hernia was present.      The entire examined stomach was normal.      The examined duodenum was normal. Impression:            - Non-obstructing Schatzki ring. Dilated.                        - Normal mucosa was found in the entire esophagus.                        - 5 cm hiatal hernia.                        - Normal stomach.                        - Normal examined duodenum.                        - Biopsies were taken with a cold forceps for                         evaluation of eosinophilic esophagitis. Recommendation:        - Discharge patient to home.                        - Resume previous diet.                        - Continue present medications.                        - Await pathology results.                        - Return to referring physician as previously                         scheduled. Procedure Code(s):     ---  Professional ---                        [phone], 59, Esophagogastroduodenoscopy, flexible,                         transoral; with transendoscopic balloon dilation of                         esophagus (less than 30 mm diameter) Diagnosis Code(s):     --- Professional ---                        K22.2, Esophageal obstruction                        K44.9, Diaphragmatic hernia without obstruction or                         gangrene                        R13.10, Dysphagia, unspecified CPT copyright 2022 American Medical Association. All rights reserved. The codes documented in this report are preliminary and upon coder review may  be revised to meet current compliance requirements. Ole Schick MD, MD 02/04/2024 8:37:43 AM Number of Addenda: 0 Note Initiated On: 02/04/2024 7:59 AM Estimated Blood Loss:  Estimated blood loss was minimal.      [facility]

## 2024-02-04 NOTE — Transfer of Care (Signed)
 Immediate Anesthesia Transfer of Care Note  Patient: Alison Mcclain  Procedure(s) Performed: COLONOSCOPY WITH PROPOFOL  ESOPHAGOGASTRODUODENOSCOPY (EGD) WITH PROPOFOL  ENTEROSCOPY, USING BALLOON  Patient Location: Endoscopy Unit  Anesthesia Type:General  Level of Consciousness: drowsy  Airway & Oxygen Therapy: Patient Spontanous Breathing  Post-op Assessment: Report given to RN  Post vital signs: stable  Last Vitals:  Vitals Value Taken Time  BP    Temp 36 C 02/04/24 08:35  Pulse 85 02/04/24 08:38  Resp 18 02/04/24 08:38  SpO2 96 % 02/04/24 08:38  Vitals shown include unfiled device data.  Last Pain:  Vitals:   02/04/24 0732  PainSc: 0-No pain         Complications: No notable events documented.

## 2024-02-04 NOTE — Interval H&P Note (Signed)
 History and Physical Interval Note:  02/04/2024 7:53 AM  Alison Mcclain  has presented today for surgery, with the diagnosis of CCA SCREEN, Phayrgoesophageal dysphagia.  The various methods of treatment have been discussed with the patient and family. After consideration of risks, benefits and other options for treatment, the patient has consented to  Procedure(s): COLONOSCOPY WITH PROPOFOL  (N/A) ESOPHAGOGASTRODUODENOSCOPY (EGD) WITH PROPOFOL  (N/A) as a surgical intervention.  The patient's history has been reviewed, patient examined, no change in status, stable for surgery.  I have reviewed the patient's chart and labs.  Questions were answered to the patient's satisfaction.     Alison Mcclain  Ok to proceed with EGD/Colonoscopy

## 2024-02-04 NOTE — Op Note (Signed)
 [facility] Patient Name: Alison Mcclain Procedure Date: 02/04/2024 7:58 AM MRN: [id number] Account #: [id number] Date of Birth: 24-Jan-1971 Admit Type: Outpatient Age: 53 Room: [facility] ENDO ROOM 3 Gender: Female Note Status: Finalized Instrument Name: Peds Colonoscope [id number] Procedure:             Colonoscopy Indications:           Surveillance: Personal history of adenomatous polyps                         on last colonoscopy > 3 years ago Providers:             Ole Schick MD, MD Referring MD:          Idelia LABOR. Zachary MD, MD (Referring MD) Medicines:             Monitored Anesthesia Care Complications:         No immediate complications. Procedure:             Pre-Anesthesia Assessment:                        - Prior to the procedure, a History and Physical was                         performed, and patient medications and allergies were                         reviewed. The patient is competent. The risks and                         benefits of the procedure and the sedation options and                         risks were discussed with the patient. All questions                         were answered and informed consent was obtained.                         Patient identification and proposed procedure were                         verified by the physician, the nurse, the                         anesthesiologist, the anesthetist and the technician                         in the endoscopy suite. Mental Status Examination:                         alert and oriented. Airway Examination: normal                         oropharyngeal airway and neck mobility. Respiratory                         Examination: clear to auscultation. CV Examination:  normal. Prophylactic Antibiotics: The patient does not                         require prophylactic antibiotics. Prior                         Anticoagulants: The patient has taken no  anticoagulant                         or antiplatelet agents. ASA Grade Assessment: II - A                         patient with mild systemic disease. After reviewing                         the risks and benefits, the patient was deemed in                         satisfactory condition to undergo the procedure. The                         anesthesia plan was to use monitored anesthesia care                         (MAC). Immediately prior to administration of                         medications, the patient was re-assessed for adequacy                         to receive sedatives. The heart rate, respiratory                         rate, oxygen saturations, blood pressure, adequacy of                         pulmonary ventilation, and response to care were                         monitored throughout the procedure. The physical                         status of the patient was re-assessed after the                         procedure.                        After obtaining informed consent, the colonoscope was                         passed under direct vision. Throughout the procedure,                         the patient's blood pressure, pulse, and oxygen                         saturations were monitored continuously. The  Colonoscope was introduced through the anus and                         advanced to the the cecum, identified by appendiceal                         orifice and ileocecal valve. The colonoscopy was                         performed without difficulty. The patient tolerated                         the procedure well. The quality of the bowel                         preparation was adequate to identify polyps. The                         ileocecal valve, appendiceal orifice, and rectum were                         photographed. Findings:      The perianal and digital rectal examinations were normal.      Internal hemorrhoids were found during  retroflexion. The hemorrhoids       were Grade I (internal hemorrhoids that do not prolapse).      The exam was otherwise without abnormality on direct and retroflexion       views. Impression:            - Internal hemorrhoids.                        - The examination was otherwise normal on direct and                         retroflexion views.                        - No specimens collected. Recommendation:        - Discharge patient to home.                        - Resume previous diet.                        - Continue present medications.                        - Repeat colonoscopy in 5 years for surveillance.                        - Return to referring physician as previously                         scheduled. Procedure Code(s):     --- Professional ---                        H9894, Colorectal cancer screening; colonoscopy on                         individual  at high risk Diagnosis Code(s):     --- Professional ---                        Z86.010, Personal history of colonic polyps                        K64.0, First degree hemorrhoids CPT copyright 2022 [employer]. All rights reserved. The codes documented in this report are preliminary and upon coder review may  be revised to meet current compliance requirements. Ole Schick MD, MD 02/04/2024 8:40:53 AM Number of Addenda: 0 Note Initiated On: 02/04/2024 7:58 AM Scope Withdrawal Time: 0 hours 8 minutes 19 seconds  Total Procedure Duration: 0 hours 16 minutes 28 seconds  Estimated Blood Loss:  Estimated blood loss: none.      [facility]

## 2024-02-04 NOTE — H&P (Signed)
 Outpatient short stay form Pre-procedure 02/04/2024  Alison Mcclain Schick, MD  Primary Physician: George, Sionne A, MD  Reason for visit:  Dysphagia/Surveillance colonoscopy  History of present illness:    53 y/o lady with history of bipolar, HLD, and DM II here for EGD for dysphagia and colonoscopy for history of adenomatous polyps. Last colonoscopy in 2021 with poor prep. No blood thinners. History of hysterectomy. No family history of GI malignancies.    Current Facility-Administered Medications:    0.9 %  sodium chloride  infusion, , Intravenous, Continuous, Zeriyah Wain, Alison ONEIDA, MD  Facility-Administered Medications Ordered in Other Encounters:    0.9 %  sodium chloride  infusion, , Intravenous, Continuous PRN, Rosine Shona Jansky, CRNA, New Bag at 02/04/24 9262  Medications Prior to Admission  Medication Sig Dispense Refill Last Dose/Taking   buPROPion (WELLBUTRIN XL) 150 MG 24 hr tablet Take 150 mg by mouth daily.   Past Week   Insulin Degludec (TRESIBA FLEXTOUCH [place]) Inject into the skin.   02/01/2024   lithium 300 MG tablet Take 900 mg by mouth at bedtime.   Past Week   metFORMIN (GLUCOPHAGE) 500 MG tablet Take by mouth 2 (two) times daily with a meal.   Past Week   simvastatin (ZOCOR) 10 MG tablet Take 10 mg by mouth daily.   Past Week   sitaGLIPtin (JANUVIA) 100 MG tablet Take 100 mg by mouth daily.   Past Month   traZODone (DESYREL) 100 MG tablet Take 200 mg by mouth at bedtime.   Past Week   clonazePAM (KLONOPIN) 0.5 MG tablet Take 0.5 mg by mouth at bedtime. May take 1 tab daily prn      glipiZIDE (GLUCOTROL XL) 10 MG 24 hr tablet Take 10 mg by mouth daily. Take 1 tablet by mouth once a day (Patient not taking: Reported on 02/04/2024)   Not Taking   hydrocortisone (ANUSOL-HC) 25 MG suppository Place 25 mg rectally 2 (two) times daily.      SUMAtriptan (IMITREX) 25 MG tablet Take by mouth.        Allergies  Allergen Reactions   Keflex [Cephalexin] Swelling   Lamictal  [Lamotrigine]    Metformin And Related    Penicillins    Rexulti [Brexpiprazole]      Past Medical History:  Diagnosis Date   Anxiety    Bipolar 1 disorder (HCC)    Colon polyp    Diabetes mellitus without complication (HCC)    Dysphagia    Fatty liver    GERD (gastroesophageal reflux disease)    Herpes genitalis in women    Hiatal hernia    History of hiatal hernia    Hyperlipidemia    Lithium use    Obesity    Post-traumatic stress disorder, unspecified    Rectal bleeding    Vitamin B 12 deficiency    Vitamin D insufficiency     Review of systems:  Otherwise negative.    Physical Exam  Gen: Alert, oriented. Appears stated age.  HEENT: PERRLA. Lungs: No respiratory distress CV: RRR Abd: soft, benign, no masses Ext: No edema    Planned procedures: Proceed with colonoscopy. The patient understands the nature of the planned procedure, indications, risks, alternatives and potential complications including but not limited to bleeding, infection, perforation, damage to internal organs and possible oversedation/side effects from anesthesia. The patient agrees and gives consent to proceed.  Please refer to procedure notes for findings, recommendations and patient disposition/instructions.     Alison Mcclain Schick, MD [facility]

## 2024-02-04 NOTE — Anesthesia Preprocedure Evaluation (Signed)
 Anesthesia Evaluation  Patient identified by MRN, date of birth, ID band Patient awake    Reviewed: Allergy & Precautions, NPO status , Patient's Chart, lab work & pertinent test results  History of Anesthesia Complications Negative for: history of anesthetic complications  Airway Mallampati: II  TM Distance: >3 FB Neck ROM: Full    Dental  (+) Dental Advidsory Given, Caps, Teeth Intact   Pulmonary neg shortness of breath, neg sleep apnea, neg COPD, neg recent URI, former smoker   breath sounds clear to auscultation- rhonchi (-) wheezing      Cardiovascular Exercise Tolerance: Good (-) hypertension(-) angina (-) CAD, (-) Past MI and (-) Cardiac Stents (-) dysrhythmias  Rhythm:Regular Rate:Normal - Systolic murmurs and - Diastolic murmurs    Neuro/Psych  PSYCHIATRIC DISORDERS Anxiety  Bipolar Disorder   negative neurological ROS     GI/Hepatic Neg liver ROS, hiatal hernia,GERD  ,,  Endo/Other  diabetes, Type 2, Oral Hypoglycemic Agents    Renal/GU negative Renal ROS     Musculoskeletal negative musculoskeletal ROS (+)    Abdominal  (+) + obese  Peds  Hematology negative hematology ROS (+)   Anesthesia Other Findings Past Medical History: No date: Anxiety No date: Bipolar 1 disorder (HCC) No date: Colon polyp No date: Diabetes mellitus without complication (HCC) No date: Dysphagia No date: Fatty liver No date: GERD (gastroesophageal reflux disease) No date: Herpes genitalis in women No date: Hiatal hernia No date: History of hiatal hernia No date: Hyperlipidemia No date: Obesity No date: Rectal bleeding   Reproductive/Obstetrics                              Anesthesia Physical Anesthesia Plan  ASA: 2  Anesthesia Plan: General   Post-op Pain Management:    Induction: Intravenous  PONV Risk Score and Plan: 3 and Propofol  infusion and TIVA  Airway Management Planned: Natural  Airway and Nasal Cannula  Additional Equipment:   Intra-op Plan:   Post-operative Plan:   Informed Consent: I have reviewed the patients History and Physical, chart, labs and discussed the procedure including the risks, benefits and alternatives for the proposed anesthesia with the patient or authorized representative who has indicated his/her understanding and acceptance.     Dental advisory given  Plan Discussed with: CRNA and Anesthesiologist  Anesthesia Plan Comments:          Anesthesia Quick Evaluation

## 2024-02-05 LAB — SURGICAL PATHOLOGY

## 2024-02-05 NOTE — Anesthesia Postprocedure Evaluation (Signed)
 Anesthesia Post Note  Patient: Alison Mcclain  Procedure(s) Performed: COLONOSCOPY WITH PROPOFOL  ESOPHAGOGASTRODUODENOSCOPY (EGD) WITH PROPOFOL  ENTEROSCOPY, USING BALLOON  Patient location during evaluation: Endoscopy Anesthesia Type: General Level of consciousness: awake and alert Pain management: pain level controlled Vital Signs Assessment: post-procedure vital signs reviewed and stable Respiratory status: spontaneous breathing, nonlabored ventilation, respiratory function stable and patient connected to nasal cannula oxygen Cardiovascular status: blood pressure returned to baseline and stable Postop Assessment: no apparent nausea or vomiting Anesthetic complications: no   No notable events documented.   Last Vitals:  Vitals:   02/04/24 0732 02/04/24 0835  BP: 122/73 98/61  Pulse: 79   Resp: 18   Temp: 36.7 C (!) 36 C  SpO2: 100%     Last Pain:  Vitals:   02/04/24 0905  PainSc: 0-No pain                 Prentice Murphy
